# Patient Record
Sex: Male | Born: 2000 | Hispanic: Yes | Marital: Single | Smoking: Never smoker
Health system: Southern US, Community
[De-identification: ages and names within clinical notes are randomized; demographics above are authoritative.]

## PROBLEM LIST (undated history)

## (undated) DIAGNOSIS — T148XXA Other injury of unspecified body region, initial encounter: Secondary | ICD-10-CM

## (undated) DIAGNOSIS — S62339A Displaced fracture of neck of unspecified metacarpal bone, initial encounter for closed fracture: Secondary | ICD-10-CM

## (undated) HISTORY — PX: FRACTURE SURGERY: SHX138

---
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
## 2018-01-08 ENCOUNTER — Emergency Department (HOSPITAL_COMMUNITY)
Admission: EM | Admit: 2018-01-08 | Discharge: 2018-01-08 | Disposition: A | Discharge status: Home / Self Care | Payer: Medicaid Other | Attending: Emergency Medicine | Admitting: Emergency Medicine

## 2018-01-08 ENCOUNTER — Other Ambulatory Visit: Payer: Self-pay

## 2018-01-08 ENCOUNTER — Encounter (HOSPITAL_COMMUNITY): Payer: Self-pay

## 2018-01-08 DIAGNOSIS — R05 Cough: Secondary | ICD-10-CM | POA: Diagnosis present

## 2018-01-08 DIAGNOSIS — B349 Viral infection, unspecified: Secondary | ICD-10-CM | POA: Insufficient documentation

## 2018-01-08 DIAGNOSIS — J069 Acute upper respiratory infection, unspecified: Secondary | ICD-10-CM

## 2018-01-08 MED ORDER — IBUPROFEN 100 MG/5ML PO SUSP: 400.0000 mg | Freq: Four times a day (QID) | ORAL | 1 refills | Status: DC | PRN

## 2018-01-08 MED ORDER — GUAIFENESIN-DM 100-10 MG/5ML PO SYRP: 5.0000 mL | ORAL_SOLUTION | ORAL | 0 refills | Status: DC | PRN

## 2018-01-08 MED ORDER — IBUPROFEN 100 MG/5ML PO SUSP
600.0000 mg | Freq: Once | ORAL | Status: AC
  Administered 2018-01-08: 600 mg via ORAL
  Filled 2018-01-08: qty 30

## 2018-01-08 NOTE — ED Triage Notes (Signed)
Pt states for 2 nights he has been waking up "burning up." Pt also states he has had a cough. No distress noted. Pt also reports nasal congestion.

## 2018-01-08 NOTE — Discharge Instructions (Signed)
As discussed, get some rest and drink plenty of fluids to keep yourself well hydrated.  Take ibuprofen or Tylenol as needed for fever.  Do not exceed 4000 mg of acetaminophen (tylenol) in a 24-hour period.  Acetaminophen is found in many over-the-counter medications including NyQuil and DayQuil.  Follow-up with your primary care provider in a few days. Return if symptoms worsen or new concerning symptoms in the meantime.

## 2018-01-08 NOTE — ED Notes (Signed)
Cup of water provided to patient for fluid challenge.  
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
## 2018-01-08 NOTE — ED Notes (Signed)
See EDP secondary assessment.  

## 2019-03-17 ENCOUNTER — Emergency Department (HOSPITAL_COMMUNITY)
Admission: EM | Admit: 2019-03-17 | Discharge: 2019-03-17 | Disposition: A | Discharge status: Home / Self Care | Payer: Medicaid Other

## 2019-03-17 NOTE — ED Triage Notes (Signed)
While awaiting triage, pt states he needs to leave. Notified of risks leaving

## 2019-03-18 ENCOUNTER — Other Ambulatory Visit: Payer: Self-pay

## 2019-03-18 ENCOUNTER — Emergency Department (HOSPITAL_COMMUNITY): Payer: Self-pay

## 2019-03-18 ENCOUNTER — Emergency Department (HOSPITAL_COMMUNITY)
Admission: EM | Admit: 2019-03-18 | Discharge: 2019-03-18 | Disposition: A | Discharge status: Home / Self Care | Payer: Self-pay | Attending: Emergency Medicine | Admitting: Emergency Medicine

## 2019-03-18 DIAGNOSIS — R0789 Other chest pain: Secondary | ICD-10-CM

## 2019-03-18 DIAGNOSIS — S43121A Dislocation of right acromioclavicular joint, 100%-200% displacement, initial encounter: Secondary | ICD-10-CM | POA: Insufficient documentation

## 2019-03-18 DIAGNOSIS — Y929 Unspecified place or not applicable: Secondary | ICD-10-CM | POA: Insufficient documentation

## 2019-03-18 DIAGNOSIS — Z79899 Other long term (current) drug therapy: Secondary | ICD-10-CM | POA: Insufficient documentation

## 2019-03-18 DIAGNOSIS — S43102A Unspecified dislocation of left acromioclavicular joint, initial encounter: Secondary | ICD-10-CM

## 2019-03-18 DIAGNOSIS — Y939 Activity, unspecified: Secondary | ICD-10-CM | POA: Insufficient documentation

## 2019-03-18 DIAGNOSIS — Y999 Unspecified external cause status: Secondary | ICD-10-CM | POA: Insufficient documentation

## 2019-03-18 MED ORDER — IBUPROFEN 200 MG PO TABS
600.0000 mg | ORAL_TABLET | Freq: Once | ORAL | Status: AC
  Administered 2019-03-18: 600 mg via ORAL
  Filled 2019-03-18: qty 1

## 2019-03-18 MED ORDER — HYDROCODONE-ACETAMINOPHEN 5-325 MG PO TABS: 2.0000 | ORAL_TABLET | Freq: Three times a day (TID) | ORAL | 0 refills | Status: DC | PRN
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
## 2019-03-18 NOTE — ED Notes (Signed)
Patient verbalizes understanding of discharge instructions. Opportunity for questioning and answers were provided. Armband removed by staff, pt discharged from ED.  

## 2019-03-18 NOTE — ED Triage Notes (Signed)
No other injuries noted.
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
## 2019-03-18 NOTE — ED Notes (Signed)
Patient transported to X-ray 

## 2019-03-18 NOTE — Progress Notes (Signed)
Orthopedic Tech Progress Note Patient Details:  Joshua Rangel Aug 15, 2001 [id number]  Ortho Devices Type of Ortho Device: Shoulder immobilizer Ortho Device/Splint Location: ULE Ortho Device/Splint Interventions: Adjustment, Application, Ordered   Post Interventions Patient Tolerated: Fair Instructions Provided: Care of device, Adjustment of device   Joshua Rangel 03/18/2019, 4:24 PM

## 2019-03-25 ENCOUNTER — Ambulatory Visit: Payer: Medicaid Other | Admitting: Orthopedic Surgery

## 2019-11-08 ENCOUNTER — Ambulatory Visit: Payer: Medicaid Other | Admitting: Family Medicine

## 2020-01-11 ENCOUNTER — Encounter (HOSPITAL_COMMUNITY): Payer: Self-pay | Admitting: Emergency Medicine

## 2020-01-11 ENCOUNTER — Emergency Department (HOSPITAL_COMMUNITY)
Admission: EM | Admit: 2020-01-11 | Discharge: 2020-01-11 | Disposition: A | Discharge status: Home / Self Care | Payer: Medicaid Other | Attending: Emergency Medicine | Admitting: Emergency Medicine

## 2020-01-11 ENCOUNTER — Other Ambulatory Visit: Payer: Self-pay

## 2020-01-11 ENCOUNTER — Emergency Department (HOSPITAL_COMMUNITY): Payer: Medicaid Other

## 2020-01-11 DIAGNOSIS — Y999 Unspecified external cause status: Secondary | ICD-10-CM | POA: Insufficient documentation

## 2020-01-11 DIAGNOSIS — Y929 Unspecified place or not applicable: Secondary | ICD-10-CM | POA: Insufficient documentation

## 2020-01-11 DIAGNOSIS — S62231A Other displaced fracture of base of first metacarpal bone, right hand, initial encounter for closed fracture: Secondary | ICD-10-CM

## 2020-01-11 DIAGNOSIS — W231XXA Caught, crushed, jammed, or pinched between stationary objects, initial encounter: Secondary | ICD-10-CM | POA: Insufficient documentation

## 2020-01-11 DIAGNOSIS — Y9389 Activity, other specified: Secondary | ICD-10-CM | POA: Insufficient documentation

## 2020-01-11 MED ORDER — IBUPROFEN 600 MG PO TABS: 600.0000 mg | ORAL_TABLET | Freq: Four times a day (QID) | ORAL | 0 refills | Status: AC | PRN

## 2020-01-11 MED ORDER — IBUPROFEN 400 MG PO TABS: 600.0000 mg | ORAL_TABLET | Freq: Once | ORAL | Status: DC

## 2020-01-11 MED ORDER — HYDROCODONE-ACETAMINOPHEN 5-325 MG PO TABS: 1.0000 | ORAL_TABLET | Freq: Four times a day (QID) | ORAL | 0 refills | Status: DC | PRN

## 2020-01-11 MED ORDER — HYDROCODONE-ACETAMINOPHEN 5-325 MG PO TABS
1.0000 | ORAL_TABLET | Freq: Once | ORAL | Status: AC
  Administered 2020-01-11: 1 via ORAL
  Filled 2020-01-11: qty 1

## 2020-01-11 NOTE — Progress Notes (Signed)
Orthopedic Tech Progress Note Patient Details:  Joshua Rangel 04/30/2001 [id number]  Ortho Devices Type of Ortho Device: Arm sling, Thumb spica splint Splint Material: Fiberglass Ortho Device/Splint Location: rue Ortho Device/Splint Interventions: Ordered, Application, Adjustment   Post Interventions Patient Tolerated: Well Instructions Provided: Care of device, Adjustment of device   [name] Post 01/11/2020, 11:11 PM

## 2020-01-11 NOTE — Discharge Instructions (Signed)
You have a fracture in your thumb, you will need to remain in splint until you are seen by Dr. Izora Ribas with hand surgery.  Please call tomorrow morning to schedule follow-up appointment with him.  Take ibuprofen 600 mg every 6 hours, take Norco as needed for breakthrough pain.  Ice and elevate the hand.  Keep your splint clean and dry until you are seen by the hand specialist.

## 2020-01-11 NOTE — ED Provider Notes (Signed)
[facility] EMERGENCY DEPARTMENT Provider Note   CSN: [id number] Arrival date & time: 01/11/20  1858     History Chief Complaint  Patient presents with  . Hand Injury    Joshua Rangel is a 19 y.o. male.  Joshua Rangel is a 19 y.o. male who is otherwise healthy, presents to the emergency department for evaluation of injury to his right thumb.  He reports that around 6 PM tonight he accidentally slammed his thumb in the car door and thinks he may have broken or dislocated it.  He reports swelling and pain and he is only able to minimally move the thumb.  He denies numbness or tingling.  No cuts to the thumb.  He has not taken anything for pain prior to arrival.  No other aggravating or alleviating factors.        History reviewed. No pertinent past medical history.  There are no problems to display for this patient.   History reviewed. No pertinent surgical history.     No family history on file.  Social History   Tobacco Use  . Smoking status: Never Smoker  . Smokeless tobacco: Never Used  Substance Use Topics  . Alcohol use: Yes    Comment: occ  . Drug use: Yes    Types: Marijuana    Home Medications Prior to Admission medications   Medication Sig Start Date End Date Taking? Authorizing Provider  guaiFENesin-dextromethorphan (ROBITUSSIN DM) 100-10 MG/5ML syrup Take 5 mLs by mouth every 4 (four) hours as needed for cough. 01/08/18   Emeline General, PA-C  HYDROcodone-acetaminophen (NORCO/VICODIN) 5-325 MG tablet Take 2 tablets by mouth every 8 (eight) hours as needed. 03/18/19   Couture, Cortni S, PA-C  ibuprofen (ADVIL,MOTRIN) 100 MG/5ML suspension Take 20 mLs (400 mg total) by mouth every 6 (six) hours as needed for fever. 01/08/18   Emeline General, PA-C    Allergies    Patient has no known allergies.  Review of Systems   Review of Systems  Constitutional: Negative for chills and fever.  Musculoskeletal: Positive for arthralgias and joint  swelling.  Skin: Negative for color change and rash.  Neurological: Negative for weakness and numbness.    Physical Exam Updated Vital Signs BP 121/86 (BP Location: Right Arm)   Pulse 98   Temp 98.7 F (37.1 C) (Oral)   Resp 18   Ht 5\' 9"  (1.753 m)   Wt 59 kg   SpO2 100%   BMI 19.20 kg/m   Physical Exam Vitals and nursing note reviewed.  Constitutional:      General: He is not in acute distress.    Appearance: Normal appearance. He is well-developed and normal weight. He is not ill-appearing or diaphoretic.  HENT:     Head: Normocephalic and atraumatic.  Eyes:     General:        Right eye: No discharge.        Left eye: No discharge.  Pulmonary:     Effort: Pulmonary effort is normal. No respiratory distress.  Musculoskeletal:        General: Swelling and tenderness present.     Comments: Swelling and tenderness noted over the right thumb with some deformity, range of motion limited due to pain.  Sensation intact throughout.  Good capillary refill.  2+ radial pulse, no tenderness swelling or deformity to the other metacarpals or fingers.  Skin:    General: Skin is warm and dry.  Neurological:     Mental  Status: He is alert and oriented to person, place, and time.     Coordination: Coordination normal.  Psychiatric:        Mood and Affect: Mood normal.        Behavior: Behavior normal.     ED Results / Procedures / Treatments   Labs (all labs ordered are listed, but only abnormal results are displayed) Labs Reviewed - No data to display  EKG None  Radiology DG Hand Complete Right  Result Date: 01/11/2020 CLINICAL DATA:  19 year old male slammed hand in car door. Pain around the thumb. EXAM: RIGHT HAND - COMPLETE 3+ VIEW COMPARISON:  None. FINDINGS: Bone mineralization is within normal limits. Distal radius and ulna intact. Carpal bones appear intact and normally aligned. There is a comminuted fracture through the proximal metadiaphysis of the right thumb  metacarpal. Intra-articular extension is demonstrated. There is 1/2 shaft width radial displacement. Mild impaction. There may be a healed chronic fracture of the 5th metacarpal. Other metacarpals and the phalanges appear intact. Normal joint spaces. IMPRESSION: Comminuted, mildly displaced and intra-articular fracture of the proximal right thumb metacarpal. Electronically Signed   By: Odessa Fleming M.D.   On: 01/11/2020 19:31    Procedures Procedures (including critical care time)  Medications Ordered in ED Medications  HYDROcodone-acetaminophen (NORCO/VICODIN) 5-325 MG per tablet 1 tablet (has no administration in time range)  ibuprofen (ADVIL) tablet 600 mg (has no administration in time range)    ED Course  I have reviewed the triage vital signs and the nursing notes.  Pertinent labs & imaging results that were available during my care of the patient were reviewed by me and considered in my medical decision making (see chart for details).    MDM Rules/Calculators/A&P                     19 year old male presents with injury to the thumb after slamming it in the car door.  The thumb is neurovascularly intact, there is no injury to the nail or subungual hematoma.  No cuts to suggest open fracture.  X-ray shows a comminuted, and mildly displaced, intra-articular fracture of the proximal right thumb metacarpal.  Case discussed with Dr. Izora Ribas with hand surgery who recommends thumb spica splint and he will have the patient follow-up in the office.  Patient placed in splint, pain treated with ibuprofen and Norco, prescriptions for pain medicine sent in for the patient, discussed ice, elevation, and outpatient follow-up.  Patient expresses understanding and agreement with plan.  Discharged home in good condition.   Final Clinical Impression(s) / ED Diagnoses Final diagnoses:  Closed displaced fracture of base of first metacarpal bone of right hand, unspecified fracture morphology, initial encounter     Rx / DC Orders ED Discharge Orders         Ordered    HYDROcodone-acetaminophen (NORCO) 5-325 MG tablet  Every 6 hours PRN     01/11/20 2233    ibuprofen (ADVIL) 600 MG tablet  Every 6 hours PRN     01/11/20 2233           Dartha Lodge, PA-C 01/11/20 2239    Tilden Fossa, MD 01/12/20 [phone]

## 2020-01-11 NOTE — ED Notes (Signed)
Othro called

## 2020-01-11 NOTE — ED Triage Notes (Signed)
Pt believes he "dislocated my thumb on the car door."  Swelling noted.

## 2020-01-20 ENCOUNTER — Other Ambulatory Visit: Payer: Self-pay

## 2020-01-20 ENCOUNTER — Encounter (HOSPITAL_COMMUNITY): Payer: Self-pay | Admitting: General Surgery

## 2020-01-20 ENCOUNTER — Other Ambulatory Visit (HOSPITAL_COMMUNITY)
Admission: RE | Admit: 2020-01-20 | Discharge: 2020-01-20 | Disposition: A | Discharge status: Home / Self Care | Payer: Medicaid Other | Source: Ambulatory Visit | Attending: General Surgery | Admitting: General Surgery

## 2020-01-20 DIAGNOSIS — Z01812 Encounter for preprocedural laboratory examination: Secondary | ICD-10-CM | POA: Insufficient documentation

## 2020-01-20 DIAGNOSIS — Z20822 Contact with and (suspected) exposure to covid-19: Secondary | ICD-10-CM | POA: Insufficient documentation

## 2020-01-20 LAB — SARS CORONAVIRUS 2 (TAT 6-24 HRS): SARS Coronavirus 2: NEGATIVE

## 2020-01-20 NOTE — Progress Notes (Signed)
Pt denies SOB, chest pain, being under the care of a cardiologist and PCP. Pt denies having a stress test, echo and cardiac cath. Pt denies having an EKG and chest x ray. Pt denies recent labs. Pt made aware to stop taking  Aspirin (unless otherwise advised by surgeon), vitamins, fish oil and herbal medications. Do not take any NSAIDs ie: Ibuprofen, Advil, Naproxen (Aleve), Motrin, BC and Goody Powder. Pt reminded to quarantine. Pt verbalized understanding of all pre-op instructions.

## 2020-01-21 ENCOUNTER — Ambulatory Visit (HOSPITAL_COMMUNITY): Payer: Medicaid Other | Admitting: Anesthesiology

## 2020-01-21 ENCOUNTER — Encounter (HOSPITAL_COMMUNITY)
Admission: RE | Disposition: A | Discharge status: Home / Self Care | Payer: Self-pay | Source: Home / Self Care | Attending: General Surgery

## 2020-01-21 ENCOUNTER — Encounter (HOSPITAL_COMMUNITY): Payer: Self-pay | Admitting: General Surgery

## 2020-01-21 ENCOUNTER — Other Ambulatory Visit: Payer: Self-pay

## 2020-01-21 ENCOUNTER — Ambulatory Visit (HOSPITAL_COMMUNITY)
Admission: RE | Admit: 2020-01-21 | Discharge: 2020-01-21 | Disposition: A | Discharge status: Home / Self Care | Payer: Medicaid Other | Attending: General Surgery | Admitting: General Surgery

## 2020-01-21 ENCOUNTER — Ambulatory Visit (HOSPITAL_COMMUNITY): Payer: Medicaid Other

## 2020-01-21 DIAGNOSIS — W230XXA Caught, crushed, jammed, or pinched between moving objects, initial encounter: Secondary | ICD-10-CM | POA: Insufficient documentation

## 2020-01-21 DIAGNOSIS — S62201A Unspecified fracture of first metacarpal bone, right hand, initial encounter for closed fracture: Secondary | ICD-10-CM | POA: Insufficient documentation

## 2020-01-21 HISTORY — DX: Other injury of unspecified body region, initial encounter: T14.8XXA

## 2020-01-21 HISTORY — PX: PERCUTANEOUS PINNING: SHX2209

## 2020-01-21 LAB — COMPREHENSIVE METABOLIC PANEL
ALT: 16 U/L (ref 0–44)
AST: 23 U/L (ref 15–41)
Albumin: 4.4 g/dL (ref 3.5–5.0)
Alkaline Phosphatase: 49 U/L (ref 38–126)
Anion gap: 11 (ref 5–15)
BUN: 9 mg/dL (ref 6–20)
CO2: 24 mmol/L (ref 22–32)
Calcium: 9.8 mg/dL (ref 8.9–10.3)
Chloride: 103 mmol/L (ref 98–111)
Creatinine, Ser: 0.91 mg/dL (ref 0.61–1.24)
GFR calc Af Amer: >60 mL/min (ref >60)
GFR calc non Af Amer: >60 mL/min (ref >60)
Glucose, Bld: 90 mg/dL (ref 70–99)
Potassium: 4.1 mmol/L (ref 3.5–5.1)
Sodium: 138 mmol/L (ref 135–145)
Total Bilirubin: 0.9 mg/dL (ref 0.3–1.2)
Total Protein: 7.8 g/dL (ref 6.5–8.1)

## 2020-01-21 LAB — HEMOGLOBIN: Hemoglobin: 15.8 g/dL (ref 13.0–17.0)

## 2020-01-21 SURGERY — PINNING, EXTREMITY, PERCUTANEOUS
Anesthesia: General | Site: Hand | Laterality: Right

## 2020-01-21 MED ORDER — OXYCODONE HCL 5 MG PO TABS
ORAL_TABLET | ORAL | Status: AC
  Administered 2020-01-21: 5 mg via ORAL
  Filled 2020-01-21: qty 1

## 2020-01-21 MED ORDER — MIDAZOLAM HCL 2 MG/2ML IJ SOLN
INTRAMUSCULAR | Status: AC
  Filled 2020-01-21: qty 2

## 2020-01-21 MED ORDER — ONDANSETRON HCL 4 MG/2ML IJ SOLN
INTRAMUSCULAR | Status: DC | PRN
  Administered 2020-01-21: 4 mg via INTRAVENOUS

## 2020-01-21 MED ORDER — PROPOFOL 10 MG/ML IV BOLUS
INTRAVENOUS | Status: AC
  Filled 2020-01-21: qty 20

## 2020-01-21 MED ORDER — BUPIVACAINE HCL 0.25 % IJ SOLN
INTRAMUSCULAR | Status: DC | PRN
  Administered 2020-01-21: 10.5 mL

## 2020-01-21 MED ORDER — BUPIVACAINE HCL (PF) 0.25 % IJ SOLN
INTRAMUSCULAR | Status: AC
  Filled 2020-01-21: qty 30

## 2020-01-21 MED ORDER — FENTANYL CITRATE (PF) 100 MCG/2ML IJ SOLN
INTRAMUSCULAR | Status: DC | PRN
  Administered 2020-01-21 (×3): 50 ug via INTRAVENOUS

## 2020-01-21 MED ORDER — DEXAMETHASONE SODIUM PHOSPHATE 10 MG/ML IJ SOLN
INTRAMUSCULAR | Status: DC | PRN
  Administered 2020-01-21: 10 mg via INTRAVENOUS

## 2020-01-21 MED ORDER — OXYCODONE HCL 5 MG PO TABS: 5.0000 mg | ORAL_TABLET | Freq: Once | ORAL | Status: AC | PRN

## 2020-01-21 MED ORDER — PROPOFOL 10 MG/ML IV BOLUS
INTRAVENOUS | Status: DC | PRN
  Administered 2020-01-21: 200 mg via INTRAVENOUS

## 2020-01-21 MED ORDER — TRAMADOL HCL 50 MG PO TABS: 50.0000 mg | ORAL_TABLET | Freq: Four times a day (QID) | ORAL | 0 refills | Status: AC | PRN

## 2020-01-21 MED ORDER — FENTANYL CITRATE (PF) 100 MCG/2ML IJ SOLN: 25.0000 ug | INTRAMUSCULAR | Status: DC | PRN

## 2020-01-21 MED ORDER — ONDANSETRON HCL 4 MG/2ML IJ SOLN: 4.0000 mg | Freq: Four times a day (QID) | INTRAMUSCULAR | Status: DC | PRN

## 2020-01-21 MED ORDER — OXYCODONE HCL 5 MG/5ML PO SOLN: 5.0000 mg | Freq: Once | ORAL | Status: AC | PRN

## 2020-01-21 MED ORDER — FENTANYL CITRATE (PF) 250 MCG/5ML IJ SOLN
INTRAMUSCULAR | Status: AC
  Filled 2020-01-21: qty 5

## 2020-01-21 MED ORDER — LIDOCAINE 2% (20 MG/ML) 5 ML SYRINGE
INTRAMUSCULAR | Status: AC
  Filled 2020-01-21: qty 5

## 2020-01-21 MED ORDER — 0.9 % SODIUM CHLORIDE (POUR BTL) OPTIME
TOPICAL | Status: DC | PRN
  Administered 2020-01-21: 1000 mL

## 2020-01-21 MED ORDER — DEXAMETHASONE SODIUM PHOSPHATE 10 MG/ML IJ SOLN
INTRAMUSCULAR | Status: AC
  Filled 2020-01-21: qty 1

## 2020-01-21 MED ORDER — LACTATED RINGERS IV SOLN: INTRAVENOUS | Status: DC

## 2020-01-21 MED ORDER — CEFAZOLIN SODIUM-DEXTROSE 1-4 GM/50ML-% IV SOLN
INTRAVENOUS | Status: AC
  Filled 2020-01-21: qty 50

## 2020-01-21 MED ORDER — ONDANSETRON HCL 4 MG/2ML IJ SOLN
INTRAMUSCULAR | Status: AC
  Filled 2020-01-21: qty 2

## 2020-01-21 MED ORDER — MIDAZOLAM HCL 5 MG/5ML IJ SOLN
INTRAMUSCULAR | Status: DC | PRN
  Administered 2020-01-21 (×2): 1 mg via INTRAVENOUS

## 2020-01-21 MED ORDER — CEFAZOLIN (ANCEF) 1 G IV SOLR: 1.0000 g | INTRAVENOUS | Status: DC

## 2020-01-21 MED ORDER — LIDOCAINE 2% (20 MG/ML) 5 ML SYRINGE
INTRAMUSCULAR | Status: DC | PRN
  Administered 2020-01-21: 60 mg via INTRAVENOUS

## 2020-01-21 MED ORDER — CEFAZOLIN SODIUM-DEXTROSE 2-4 GM/100ML-% IV SOLN
2.0000 g | INTRAVENOUS | Status: AC
  Administered 2020-01-21: 1 g via INTRAVENOUS
  Filled 2020-01-21: qty 100

## 2020-01-21 MED ORDER — FENTANYL CITRATE (PF) 100 MCG/2ML IJ SOLN
INTRAMUSCULAR | Status: AC
  Administered 2020-01-21: 14:00:00 50 ug via INTRAVENOUS
  Filled 2020-01-21: qty 2

## 2020-01-21 SURGICAL SUPPLY — 45 items
BNDG ELASTIC 2 VLCR STRL LF (GAUZE/BANDAGES/DRESSINGS) ×2 IMPLANT
BNDG ELASTIC 3X5.8 VLCR STR LF (GAUZE/BANDAGES/DRESSINGS) ×3 IMPLANT
BNDG ESMARK 4X9 LF (GAUZE/BANDAGES/DRESSINGS) IMPLANT
CANISTER SUCT 3000ML PPV (MISCELLANEOUS) ×3 IMPLANT
CHLORAPREP W/TINT 26 (MISCELLANEOUS) ×3 IMPLANT
CORD BIPOLAR FORCEPS 12FT (ELECTRODE) ×3 IMPLANT
COVER SURGICAL LIGHT HANDLE (MISCELLANEOUS) ×3 IMPLANT
COVER WAND RF STERILE (DRAPES) ×3 IMPLANT
CUFF TOURN SGL QUICK 18X4 (TOURNIQUET CUFF) ×3 IMPLANT
CUFF TOURN SGL QUICK 24 (TOURNIQUET CUFF)
CUFF TRNQT CYL 24X4X16.5-23 (TOURNIQUET CUFF) IMPLANT
DRAIN TLS ROUND 10FR (DRAIN) IMPLANT
DRAPE OEC MINIVIEW 54X84 (DRAPES) ×3 IMPLANT
DRAPE SURG 17X23 STRL (DRAPES) ×3 IMPLANT
GAUZE XEROFORM 1X8 LF (GAUZE/BANDAGES/DRESSINGS) ×3 IMPLANT
GLOVE BIOGEL M 8.0 STRL (GLOVE) ×3 IMPLANT
GOWN STRL REUS W/ TWL XL LVL3 (GOWN DISPOSABLE) ×2 IMPLANT
GOWN STRL REUS W/TWL XL LVL3 (GOWN DISPOSABLE) ×4
K-WIRE DBL TROCAR .045X4 (WIRE) ×6
KIT BASIN OR (CUSTOM PROCEDURE TRAY) ×3 IMPLANT
KWIRE DBL TROCAR .045X4 (WIRE) IMPLANT
NEEDLE HYPO 22GX1.5 SAFETY (NEEDLE) IMPLANT
NS IRRIG 1000ML POUR BTL (IV SOLUTION) ×3 IMPLANT
PACK ORTHO EXTREMITY (CUSTOM PROCEDURE TRAY) ×3 IMPLANT
PAD CAST 3X4 CTTN HI CHSV (CAST SUPPLIES) IMPLANT
PAD CAST 4YDX4 CTTN HI CHSV (CAST SUPPLIES) IMPLANT
PADDING CAST ABS 4INX4YD NS (CAST SUPPLIES) ×2
PADDING CAST ABS COTTON 4X4 ST (CAST SUPPLIES) ×1 IMPLANT
PADDING CAST COTTON 3X4 STRL (CAST SUPPLIES)
PADDING CAST COTTON 4X4 STRL (CAST SUPPLIES) ×2
SPLINT FIBERGLASS 3X12 (CAST SUPPLIES) ×2 IMPLANT
SPLINT PLASTER CAST XFAST 4X15 (CAST SUPPLIES) IMPLANT
SPLINT PLASTER XTRA FAST SET 4 (CAST SUPPLIES)
SUT ETHILON 3 0 PS 1 (SUTURE) ×3 IMPLANT
SUT ETHILON 4 0 PS 2 18 (SUTURE) ×3 IMPLANT
SUT VIC AB 3-0 PS1 18 (SUTURE) ×2
SUT VIC AB 3-0 PS1 18XBRD (SUTURE) ×1 IMPLANT
SUT VICRYL 4-0 PS2 18IN ABS (SUTURE) ×3 IMPLANT
SYR BULB 3OZ (MISCELLANEOUS) ×3 IMPLANT
SYR CONTROL 10ML LL (SYRINGE) IMPLANT
TOWEL GREEN STERILE FF (TOWEL DISPOSABLE) ×3 IMPLANT
TUBE CONNECTING 20'X1/4 (TUBING) ×1
TUBE CONNECTING 20X1/4 (TUBING) ×2 IMPLANT
UNDERPAD 30X30 (UNDERPADS AND DIAPERS) ×3 IMPLANT
WATER STERILE IRR 1000ML POUR (IV SOLUTION) ×3 IMPLANT

## 2020-01-21 NOTE — Anesthesia Procedure Notes (Signed)
Procedure Name: LMA Insertion Date/Time: 01/21/2020 12:43 PM Performed by: Marny Lowenstein, CRNA Pre-anesthesia Checklist: Patient identified, Emergency Drugs available, Suction available and Patient being monitored Patient Re-evaluated:Patient Re-evaluated prior to induction Oxygen Delivery Method: Circle System Utilized Preoxygenation: Pre-oxygenation with 100% oxygen Induction Type: IV induction Ventilation: Mask ventilation without difficulty LMA: LMA inserted LMA Size: 4.0 Number of attempts: 1 Airway Equipment and Method: Bite block Placement Confirmation: positive ETCO2 Tube secured with: Tape Dental Injury: Teeth and Oropharynx as per pre-operative assessment  Comments: Performed by [place] Ingram SRNA

## 2020-01-21 NOTE — Progress Notes (Signed)
Notified Dr. Debby Bud office requesting orders.

## 2020-01-21 NOTE — Discharge Instructions (Addendum)
Discharge Instructions:  Keep your dressing clean, dry and in place until instructed to remove by Dr. Delphin Funes.  If the dressing becomes dirty or wet call the office for instructions during business hours. Elevate the extremity to help with swelling, this will also help with any discomfort. Take your medication as prescribed. No lifting with the injured  extremity. If you feel that the dressing is too tight, you may loosen it, but keep it on; finger tips should be pink; if there is a concern, call the office. [phone] Ice may be used if the injury is a fracture, do not apply ice directly to the skin. Please call the office on the next business day after discharge to arrange a follow up appointment.  Call [phone] between the hours of 9am - 5pm M-Th or 9am - 1pm on Fri. For most hand injuries and/or conditions, you may return to work using the uninjured hand (one handed duty) within 24-72 hours.  A detailed note will be provided to you at your follow up appointment or may contact the office prior to your follow up.    

## 2020-01-21 NOTE — Progress Notes (Signed)
Left message with Dr. Izora Ribas requesting orders

## 2020-01-21 NOTE — H&P (Signed)
  SW:FUXN slammed in car door  HPI:  Joshua Rangel is an 19 y.o. right handed male who presents with  Fracture right thumb from car door.  Incident occurred 01/11/20.  On evaluation pt has significant displacement of base of 1st metacarpal, needs surgical fixation  .   Associated signs/symptoms: n/a Previous treatment:  n/a  Past Medical History:  Diagnosis Date  . Fractured    right thumb    Past Surgical History:  Procedure Laterality Date  . FRACTURE SURGERY     right hand ; 2 knuckles    History reviewed. No pertinent family history.  Social History:  reports that he has never smoked. He has never used smokeless tobacco. He reports previous alcohol use. He reports current drug use. Drug: Marijuana.  Allergies: No Known Allergies  Medications: I have reviewed the patient's current medications.  Results for orders placed or performed during the hospital encounter of 01/21/20 (from the past 48 hour(s))  Hemoglobin     Status: None   Collection Time: 01/21/20 10:39 AM  Result Value Ref Range   Hemoglobin 15.8 13.0 - 17.0 g/dL    Comment: Performed at [facility], [address], [place], [place] [address]    No results found.  Pertinent items noted in HPI and remainder of comprehensive ROS otherwise negative. Temp:  [98.4 F (36.9 C)] 98.4 F (36.9 C) (03/19 1015) Pulse Rate:  [67] 67 (03/19 1015) Resp:  [16] 16 (03/19 1015) BP: (119)/(70) 119/70 (03/19 1015) SpO2:  [99 %] 99 % (03/19 1015) Weight:  [56.7 kg] 56.7 kg (03/19 1015) General appearance: alert and cooperative Resp: clear to auscultation bilaterally Cardio: regular rate and rhythm GI: soft, non-tender; bowel sounds normal; no masses,  no organomegaly Extremities: extremities normal, atraumatic, no cyanosis or edema  Except for R thumb are with edema, bruising; no open lesions   Assessment: Fracture base of Right 1st metacarpal Plan: Needs pinning I have discussed this treatment plan in  detail with patient and/or family, including the risks of the recommended treatment or surgery, the benefits and the alternatives.  The patient and/or caregiver understands that additional treatment may be necessary.  Hollie Bartus C Keirah Konitzer 01/21/2020, 11:17 AM

## 2020-01-21 NOTE — Transfer of Care (Signed)
Immediate Anesthesia Transfer of Care Note  Patient: Joshua Rangel  Procedure(s) Performed: PERCUTANEOUS PINNING EXTREMITY (Right Hand)  Patient Location: PACU  Anesthesia Type:General  Level of Consciousness: drowsy  Airway & Oxygen Therapy: Patient Spontanous Breathing and Patient connected to face mask oxygen  Post-op Assessment: Report given to RN and Post -op Vital signs reviewed and stable  Post vital signs: Reviewed and stable  Last Vitals:  Vitals Value Taken Time  BP 88/42 01/21/20 1316  Temp    Pulse 58 01/21/20 1319  Resp 15 01/21/20 1319  SpO2 100 % 01/21/20 1319  Vitals shown include unvalidated device data.  Last Pain:  Vitals:   01/21/20 1031  TempSrc:   PainSc: 0-No pain      Patients Stated Pain Goal: 5 (01/21/20 1031)  Complications: No apparent anesthesia complications

## 2020-01-21 NOTE — Anesthesia Preprocedure Evaluation (Signed)
Anesthesia Evaluation  Patient identified by MRN, date of birth, ID band Patient awake    Reviewed: Allergy & Precautions, H&P , NPO status , Patient's Chart, lab work & pertinent test results  Airway Mallampati: II   Neck ROM: full    Dental   Pulmonary neg pulmonary ROS,    breath sounds clear to auscultation       Cardiovascular negative cardio ROS   Rhythm:regular Rate:Normal     Neuro/Psych    GI/Hepatic   Endo/Other    Renal/GU      Musculoskeletal   Abdominal   Peds  Hematology   Anesthesia Other Findings   Reproductive/Obstetrics                             Anesthesia Physical Anesthesia Plan  ASA: I  Anesthesia Plan: General   Post-op Pain Management:    Induction: Intravenous  PONV Risk Score and Plan: 2 and Ondansetron, Dexamethasone, Midazolam and Treatment may vary due to age or medical condition  Airway Management Planned: LMA  Additional Equipment:   Intra-op Plan:   Post-operative Plan:   Informed Consent: I have reviewed the patients History and Physical, chart, labs and discussed the procedure including the risks, benefits and alternatives for the proposed anesthesia with the patient or authorized representative who has indicated his/her understanding and acceptance.       Plan Discussed with: CRNA, Anesthesiologist and Surgeon  Anesthesia Plan Comments:         Anesthesia Quick Evaluation  
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
## 2020-01-24 ENCOUNTER — Encounter: Payer: Self-pay | Admitting: *Deleted

## 2020-01-24 NOTE — Anesthesia Postprocedure Evaluation (Signed)
Anesthesia Post Note  Patient: Joshua Rangel  Procedure(s) Performed: PERCUTANEOUS PINNING EXTREMITY (Right Hand)     Patient location during evaluation: PACU Anesthesia Type: General Level of consciousness: awake and alert Pain management: pain level controlled Vital Signs Assessment: post-procedure vital signs reviewed and stable Respiratory status: spontaneous breathing, nonlabored ventilation, respiratory function stable and patient connected to nasal cannula oxygen Cardiovascular status: blood pressure returned to baseline and stable Postop Assessment: no apparent nausea or vomiting Anesthetic complications: no    Last Vitals:  Vitals:   01/21/20 1320 01/21/20 1415  BP: (!) 100/49 127/88  Pulse: (!) 58 (!) 54  Resp: 17 12  Temp:  36.6 C  SpO2: 100% 99%    Last Pain:  Vitals:   01/21/20 1430  TempSrc:   PainSc: 2                  Jayra Choyce S

## 2020-01-27 ENCOUNTER — Encounter: Payer: Self-pay | Admitting: Pediatrics
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
## 2020-09-15 ENCOUNTER — Encounter: Payer: Self-pay | Admitting: *Deleted

## 2020-09-15 NOTE — Congregational Nurse Program (Signed)
  Dept: [phone]   Congregational Nurse Program Note  Date of Encounter: 09/15/2020  Past Medical History: Past Medical History:  Diagnosis Date  . Fracture of metacarpal, neck 06/20/2016   right ring and small  . Fractured    right thumb    Encounter Details:  CNP Questionnaire - 09/15/20 1113      Questionnaire   Do you give verbal consent to treat you today? Yes    Visit Setting Church or Organization    Location Patient Served At [facility]    Patient Status Not Applicable    Medical Provider No    Insurance Uninsured (Includes [employer] [place])    Intervention Educate;Support;Refer    Referrals PCP - other provider          Pt came to [facility] requesting help getting medication. Client reports he has been taking his father's medication and it is helping him. He was offered [facility] services for therapy and medication management and client is not interested. Upon further assessing, client has been taking Zoloft. He does not have insurance or a primary doctor. Referred to Lavinia Sharps NP.  Nira Conn. RN CN [phone]

## 2024-05-27 ENCOUNTER — Ambulatory Visit (HOSPITAL_COMMUNITY)
Admission: EM | Admit: 2024-05-27 | Discharge: 2024-05-27 | Disposition: A | Discharge status: Home / Self Care | Attending: Nurse Practitioner | Admitting: Nurse Practitioner

## 2024-05-27 DIAGNOSIS — F1994 Other psychoactive substance use, unspecified with psychoactive substance-induced mood disorder: Secondary | ICD-10-CM | POA: Insufficient documentation

## 2024-05-27 NOTE — ED Provider Notes (Signed)
 Behavioral Health Urgent Care Medical Screening Exam  Patient Name: Joshua Rangel MRN: [id number] Date of Evaluation: 05/27/24 Chief Complaint:  worsening depressive symptoms and alcohol abuse Diagnosis:  Final diagnoses:  Substance induced mood disorder (HCC)   History of Present illness: Joshua Rangel is a 23 y.o. male with a history of alcohol use disorder, who denies any prior mental health related diagnoses, and presents to the [facility] today seeking treatment for depressive symptoms, anxiety, and worsening alcohol consumption.  Assessment and review of psychiatry symptoms: During encounter with patient, he presents with flat affect and depressed mood, attention to personal hygiene and grooming is fair, eye contact is good, speech is clear & coherent. Thought contents are organized and logical, and pt currently denies SI/HI/AVH or paranoia. There is no evidence of delusional thoughts.  Patient denies first rank symptoms, there are no overt signs of psychosis.  Patient reports that he had an argument with his wife earlier today, and she told him that he needed help, told him to come here for help, which prompted him to present to this location today.  He reports that he lives with his wife, but his parents are supportive.  Reports that over the past few months, he has been isolating himself, feeling hopeless, helpless, worthless, trouble with his focus and concentration, has difficulty enjoying things that make other people happy and wishes to make him happy in the past.  He reports poor motivation, sadness, guilt about feeling the way he is feeling, reports energy level has been low.  Appetite has been nonexistent.  He reports recurrent worrying, racing thoughts, and muscle tension, as well as trouble thinking clearly, reports mental clouding, describes symptoms consistent with psychomotor retardation; difficulty getting his mind to coordinate with his  physical body and take things that positively impact him.  He reports using alcohol since the age of 23 years old, reports that he currently uses 6 shots of whiskey daily, reports that the bottle of Cognac lasts him 3 days.  He reports that his last drink was 2 days ago, reports THC use, but states that he stopped a few weeks ago and then restarted alcohol at that time after 4 to 5 months of sobriety.  Patient reports using nicotine pouches which he places in his mouth and that helped her mood is nicotine use disorder.  He reports being apprehended by law enforcement refused to go with cocaine, but states that it was supposed to be a one-time use.  He denies any other substance use, but seems guarded, and seems not to be reporting his use accurately.  Patient denies symptoms significant for other mental health conditions such as OCD, bipolar d/o etc. denies any other substance use other than those mentioned above.  Denies mental health problems in his family with substance abuse issues in his family.  He denies having any medical problems.  Patient is currently ideations, but due to the severity of his depressive symptoms, he was offered inpatient hospitalization, for treatment and stabilization of his mental status as well as alcohol detoxification.  Patient was not receptive, does not meet criteria for involuntary commitment.  We reports patient for intensive substance abuse treatment program, and he was accepted here at the [facility] behavioral health center on second floor.  Intake date is July 30 8, at 8:30 AM.  We are discharging patient at this time, since he is not receptive to stay in our facility based crisis center for  detoxification.  [place] Suicide Risk assessment:  1. Do you wish to be dead? NO 2. Have you wished your dead or wished you could go to sleep and not wake up?  NO 3.  Have you actually had thoughts of killing yourself?   NO 4.  Have you been thinking about how you might  do this?   NO 5.  Have you had these thoughts and some intention of acting on them?  NO 6.  Have you started to work out or worked out the details to kill yourself?  NO 7.  Do you intend to carry out this plan? NO 8. On a scale of 1-5 with 1 being the least severe and 5 being the most severe answer the following questions place for intensity of ideation. ZERO 9. How many times have you had these thoughts? NO 10. When you have the thoughts how long to the last?  NO 11. Control ability.  Could you or can you stop thinking about killing herself or wanting to die if you want to?  YES 12. Are there any things anyone or anything family religion pain of death that stop you from wanting to die or acting on thoughts of committing suicide?  FAMILY 13.  What sort of reason to do have to think about wanting to die or killing yourself? NONE  14. Have you done anything, started to do anything,  or prepared to do anything to end your life? Examples: Took pills, tried to shoot yourself, cut yourself, tried to hang yourself,  took out pills but didn't swallow any, held a gun but changed your mind or it was  grabbed from your hand, went to the roof but didn't jump, collected pills, obtained  a gun, gave away valuables, wrote a will or suicide note, etc. NO.  Suicide Risk: Minimal: No identifiable suicidal ideation.  Patients presenting with no risk factors but with morbid ruminations; may be classified as minimal risk based on the severity of the depressive symptoms.   Recommendation: Discharge with [facility] planned. Patient educated on this and is in agreement to start program next week.  Flowsheet Row ED from 05/27/2024 in [facility]  C-SSRS RISK CATEGORY No Risk    Psychiatric Specialty Exam  Presentation  General Appearance:No data recorded Eye Contact:No data recorded Speech:No data recorded Speech Volume:No data recorded Handedness:No data recorded  Mood and Affect   Mood:No data recorded Affect:No data recorded  Thought Process  Thought Processes:No data recorded Descriptions of Associations:No data recorded Orientation:No data recorded Thought Content:No data recorded   Hallucinations:No data recorded Ideas of Reference:No data recorded Suicidal Thoughts:No data recorded Homicidal Thoughts:No data recorded  Sensorium  Memory:No data recorded Judgment:No data recorded Insight:No data recorded  Executive Functions  Concentration:No data recorded Attention Span:No data recorded Recall:No data recorded Fund of Knowledge:No data recorded Language:No data recorded  Psychomotor Activity  Psychomotor Activity:No data recorded  Assets  Assets:No data recorded  Sleep  Sleep:No data recorded Number of hours: No data recorded  Physical Exam: Physical Exam Vitals reviewed.  Musculoskeletal:        General: Normal range of motion.     Cervical back: Normal range of motion.  Neurological:     Mental Status: He is alert and oriented to person, place, and time.    Review of Systems  Psychiatric/Behavioral:  Positive for depression and substance abuse. Negative for hallucinations, memory loss and suicidal ideas. The patient is nervous/anxious and has insomnia.  All other systems reviewed and are negative.  Blood pressure (!) 146/88, pulse 72, temperature 98.7 F (37.1 C), temperature source Oral, resp. rate 16, SpO2 100%. There is no height or weight on file to calculate BMI.  Musculoskeletal: Strength & Muscle Tone: within normal limits Gait & Station: normal Patient leans: N/A   [facility] MSE Discharge Disposition for Follow up and Recommendations: Based on my evaluation the patient does not appear to have an emergency medical condition and can be discharged with resources and follow up care in outpatient services for Substance Abuse Intensive Outpatient Program   [place], NP 05/27/2024, 6:29 PM

## 2024-05-27 NOTE — Discharge Instructions (Addendum)
 On July 30, Wednesday at 8:30 am:  Follow up with [facility]-Please present for the Intensive Substance abuse treatment Program.   [facility] [address] [place]  [address] [phone]   Get help right away if: You have thoughts about hurting yourself or others. Get help right away if you feel like you may hurt yourself or others, or have thoughts about taking your own life. Go to your nearest emergency room or: Call 911. Call the National Suicide Prevention Lifeline at 952-271-1962 or 988 in the [place] This is open 24 hours a day. If you're a Veteran: Call 988 and press 1. This is open 24 hours a day. Text the [employer] at 409-883-6960. Summary Mental health is not just the absence of mental illness. It involves understanding your emotions and behaviors, and taking steps to manage them in a healthy way. If you have symptoms of mental or emotional distress, get help from family, friends, a health care provider, or a mental health professional. Practice good mental health behaviors such as stress management skills, self-calming skills, exercise, healthy sleeping and eating, and supportive relationships. This information is not intended to replace advice given to you by your health care provider. Make sure you discuss any questions you have with your health care provider.    Education provided on the fact that if experiencing worsening of psychiatry symptoms including suicidal ideations, homicidal ideations, or having auditory/visual hallucinations, etc, to call 911, 988, come back to this location, or go to the nearest ER. Pt verbalized understanding.

## 2024-05-27 NOTE — ED Notes (Signed)
 Patient discharged by provider.
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
## 2024-06-01 ENCOUNTER — Telehealth (HOSPITAL_COMMUNITY): Payer: Self-pay

## 2024-06-01 NOTE — Telephone Encounter (Signed)
 Made in errod

## 2024-06-02 ENCOUNTER — Ambulatory Visit (HOSPITAL_COMMUNITY): Payer: Self-pay

## 2024-06-02 ENCOUNTER — Telehealth (HOSPITAL_COMMUNITY): Payer: Self-pay | Admitting: Licensed Clinical Social Worker

## 2024-06-02 NOTE — Telephone Encounter (Signed)
 The therapist attempts to reach Joshua Rangel leaving a HIPAA-compliant voicemail after he no shows for his appointment today.  Zell Maier, MA, LCSW, [facility], LCAS 06/02/2024

## 2024-11-20 ENCOUNTER — Other Ambulatory Visit: Payer: Self-pay | Admitting: *Deleted

## 2024-11-20 DIAGNOSIS — F419 Anxiety disorder, unspecified: Secondary | ICD-10-CM

## 2024-11-20 MED ORDER — BUSPIRONE HCL 10 MG PO TABS: 10.0000 mg | ORAL_TABLET | Freq: Two times a day (BID) | ORAL | 0 refills | Status: AC

## 2024-11-20 MED ORDER — SERTRALINE HCL 25 MG PO TABS: 25.0000 mg | ORAL_TABLET | Freq: Every day | ORAL | 0 refills | Status: AC

## 2024-11-20 NOTE — Progress Notes (Signed)
 Came by for refills.  Needs 90 day refill d/t work. VS stable 110/70, 186#.  First baby due September 2026. FUA April 18
